# Patient Record
Sex: Female | Born: 2000 | Hispanic: No | Marital: Single | Smoking: Never smoker
Health system: Southern US, Community
[De-identification: ages and names within clinical notes are randomized; demographics above are authoritative.]

## PROBLEM LIST (undated history)

## (undated) DIAGNOSIS — Z789 Other specified health status: Secondary | ICD-10-CM

## (undated) HISTORY — PX: NO PAST SURGERIES: SHX2092

## (undated) HISTORY — DX: Other specified health status: Z78.9

---
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
## 2020-01-22 ENCOUNTER — Emergency Department (HOSPITAL_COMMUNITY): Payer: Medicaid Other

## 2020-01-22 ENCOUNTER — Encounter (HOSPITAL_COMMUNITY): Payer: Self-pay | Admitting: Emergency Medicine

## 2020-01-22 ENCOUNTER — Emergency Department (HOSPITAL_COMMUNITY)
Admission: EM | Admit: 2020-01-22 | Discharge: 2020-01-22 | Disposition: A | Discharge status: Home / Self Care | Payer: Medicaid Other | Attending: Emergency Medicine | Admitting: Emergency Medicine

## 2020-01-22 ENCOUNTER — Other Ambulatory Visit: Payer: Self-pay

## 2020-01-22 DIAGNOSIS — R079 Chest pain, unspecified: Secondary | ICD-10-CM

## 2020-01-22 DIAGNOSIS — R0789 Other chest pain: Secondary | ICD-10-CM | POA: Diagnosis present

## 2020-01-22 DIAGNOSIS — R0602 Shortness of breath: Secondary | ICD-10-CM | POA: Insufficient documentation

## 2020-01-22 DIAGNOSIS — Z79899 Other long term (current) drug therapy: Secondary | ICD-10-CM | POA: Insufficient documentation

## 2020-01-22 LAB — BASIC METABOLIC PANEL
Anion gap: 13 (ref 5–15)
BUN: 14 mg/dL (ref 6–20)
CO2: 18 mmol/L — ABNORMAL LOW (ref 22–32)
Calcium: 9.2 mg/dL (ref 8.9–10.3)
Chloride: 103 mmol/L (ref 98–111)
Creatinine, Ser: 0.64 mg/dL (ref 0.44–1.00)
GFR calc Af Amer: >60 mL/min (ref >60)
GFR calc non Af Amer: >60 mL/min (ref >60)
Glucose, Bld: 74 mg/dL (ref 70–99)
Potassium: 3.5 mmol/L (ref 3.5–5.1)
Sodium: 134 mmol/L — ABNORMAL LOW (ref 135–145)

## 2020-01-22 LAB — CBC
HCT: 40.3 % (ref 36.0–46.0)
Hemoglobin: 12.6 g/dL (ref 12.0–15.0)
MCH: 27 pg (ref 26.0–34.0)
MCHC: 31.3 g/dL (ref 30.0–36.0)
MCV: 86.5 fL (ref 80.0–100.0)
Platelets: 268 10*3/uL (ref 150–400)
RBC: 4.66 MIL/uL (ref 3.87–5.11)
RDW: 11.9 % (ref 11.5–15.5)
WBC: 6 10*3/uL (ref 4.0–10.5)
nRBC: 0 % (ref 0.0–0.2)

## 2020-01-22 LAB — TROPONIN I (HIGH SENSITIVITY)
Troponin I (High Sensitivity): <2 ng/L (ref <18)
Troponin I (High Sensitivity): <2 ng/L (ref <18)

## 2020-01-22 LAB — I-STAT BETA HCG BLOOD, ED (MC, WL, AP ONLY): I-stat hCG, quantitative: <5 m[IU]/mL (ref <5)

## 2020-01-22 MED ORDER — SODIUM CHLORIDE 0.9% FLUSH: 3.0000 mL | Freq: Once | INTRAVENOUS | Status: DC

## 2020-01-22 NOTE — Discharge Instructions (Signed)
Your work-up today was reassuring. We do recommend that you follow-up with your primary care doctor.  Labs/imaging results attached on back. Return here for any new/acute changes.

## 2020-01-22 NOTE — ED Triage Notes (Signed)
Pt c/o sharp chest pain that started at 1600 today, lasting 10 minutes and shortness of breath. Chest pain has since resolved.

## 2020-01-22 NOTE — ED Provider Notes (Signed)
[facility] EMERGENCY DEPARTMENT Provider Note   CSN: [id number] Arrival date & time: 01/22/20  1846     History Chief Complaint  Patient presents with  . Chest Pain  . Shortness of Breath    Stefanie Ponce is a 19 y.o. female.  The history is provided by medical records and the patient.  Chest Pain Associated symptoms: shortness of breath   Shortness of Breath Associated symptoms: chest pain     19 year old female with no significant past medical history presenting to the ED for chest pain that occurred earlier this afternoon.  States around 4 PM she had about a 7 to 10-minute episode of sharp, stabbing chest pain in her left chest with radiation to her back.  States while the pain was intense she had shortness of breath but symptoms all resolved spontaneously.  She is not had any recurrence of pain.  She denies any cough, fever, hemoptysis.  No sick contacts or known Covid exposures.  She has no known cardiac history.  She is not a smoker.  Past Medical History:  Diagnosis Date  . Medical history non-contributory     Patient Active Problem List   Diagnosis Date Noted  . Gestational hypertension 06/26/2019  . [redacted] weeks gestation of pregnancy 06/26/2019  . Indication for care in labor or delivery 06/25/2019    Past Surgical History:  Procedure Laterality Date  . NO PAST SURGERIES       OB History    Gravida  1   Para  1   Term  1   Preterm      AB      Living  1     SAB      TAB      Ectopic      Multiple  0   Live Births  1           No family history on file.  Social History   Tobacco Use  . Smoking status: Never Smoker  . Smokeless tobacco: Never Used  Substance Use Topics  . Alcohol use: Never  . Drug use: Never    Home Medications Prior to Admission medications   Medication Sig Start Date End Date Taking? Authorizing Provider  amLODipine (NORVASC) 5 MG tablet Take 1 tablet (5 mg total) by mouth at  bedtime. 06/26/19   Federico Flake, MD  calcium carbonate (TUMS - DOSED IN MG ELEMENTAL CALCIUM) 500 MG chewable tablet Chew 1 tablet by mouth daily.    [provider]  Prenatal Vit-Fe Fumarate-FA (PRENATAL VITAMIN PO) Take by mouth.    [provider]    Allergies    Patient has no known allergies.  Review of Systems   Review of Systems  Respiratory: Positive for shortness of breath.   Cardiovascular: Positive for chest pain.  All other systems reviewed and are negative.   Physical Exam Updated Vital Signs BP 117/76   Pulse 78   Temp 98.9 F (37.2 C) (Oral)   Resp 16   SpO2 100%   Physical Exam Vitals and nursing note reviewed.  Constitutional:      Appearance: She is well-developed.  HENT:     Head: Normocephalic and atraumatic.  Eyes:     Conjunctiva/sclera: Conjunctivae normal.     Pupils: Pupils are equal, round, and reactive to light.  Cardiovascular:     Rate and Rhythm: Normal rate and regular rhythm.     Heart sounds: Normal heart sounds.  Pulmonary:     Effort: Pulmonary effort is normal.     Breath sounds: Normal breath sounds. No decreased breath sounds, wheezing or rhonchi.  Abdominal:     General: Bowel sounds are normal.     Palpations: Abdomen is soft.  Musculoskeletal:        General: Normal range of motion.     Cervical back: Normal range of motion.  Skin:    General: Skin is warm and dry.  Neurological:     Mental Status: She is alert and oriented to person, place, and time.     ED Results / Procedures / Treatments   Labs (all labs ordered are listed, but only abnormal results are displayed) Labs Reviewed  BASIC METABOLIC PANEL - Abnormal; Notable for the following components:      Result Value   Sodium 134 (*)    CO2 18 (*)    All other components within normal limits  CBC  I-STAT BETA HCG BLOOD, ED ([facility], [facility], [facility] ONLY)  TROPONIN I (HIGH SENSITIVITY)  TROPONIN I (HIGH SENSITIVITY)     EKG None  Radiology DG Chest 2 View  Result Date: 01/22/2020 CLINICAL DATA:  Chest pain. EXAM: CHEST - 2 VIEW COMPARISON:  None. FINDINGS: The heart size and mediastinal contours are within normal limits. Both lungs are clear. The visualized skeletal structures are unremarkable. IMPRESSION: No active cardiopulmonary disease. Electronically Signed   By: Virgina Norfolk M.D.   On: 01/22/2020 19:30    Procedures Procedures (including critical care time)  Medications Ordered in ED Medications  sodium chloride flush (NS) 0.9 % injection 3 mL (has no administration in time range)    ED Course  I have reviewed the triage vital signs and the nursing notes.  Pertinent labs & imaging results that were available during my care of the patient were reviewed by me and considered in my medical decision making (see chart for details).    MDM Rules/Calculators/A&P  19 year old female here after a brief episode of chest pain that has resolved.  No recurrence of pain.  No known cardiac history.  She is afebrile and nontoxic in appearance here.  EKG with nonspecific T wave changes but no acute ischemia.  Labs are reassuring including troponin x2.  Chest x-ray is clear.  Patient remains pain-free throughout ER visit.  She is PERC negative.  Low suspicion for ACS, PE, dissection, or other acute cardiac event.  Feel she stable for discharge home.  Recommended close follow-up with PCP.  Return here for any new or acute changes.  Final Clinical Impression(s) / ED Diagnoses Final diagnoses:  Chest pain in adult    Rx / DC Orders ED Discharge Orders    None       Larene Pickett, PA-C 01/22/20 [place], DO 01/23/20 [phone]

## 2020-02-17 DIAGNOSIS — Z1388 Encounter for screening for disorder due to exposure to contaminants: Secondary | ICD-10-CM | POA: Diagnosis not present

## 2020-02-17 DIAGNOSIS — Z3009 Encounter for other general counseling and advice on contraception: Secondary | ICD-10-CM | POA: Diagnosis not present

## 2020-02-17 DIAGNOSIS — Z0389 Encounter for observation for other suspected diseases and conditions ruled out: Secondary | ICD-10-CM | POA: Diagnosis not present

## 2020-04-22 DIAGNOSIS — N644 Mastodynia: Secondary | ICD-10-CM | POA: Diagnosis not present

## 2020-09-29 IMAGING — CR DG CHEST 2V
2 series · 2 of 2 positions shown · non-contrast
Comparison: None.

CLINICAL DATA: Chest pain.

EXAM:
CHEST - 2 VIEW

[chest pa]
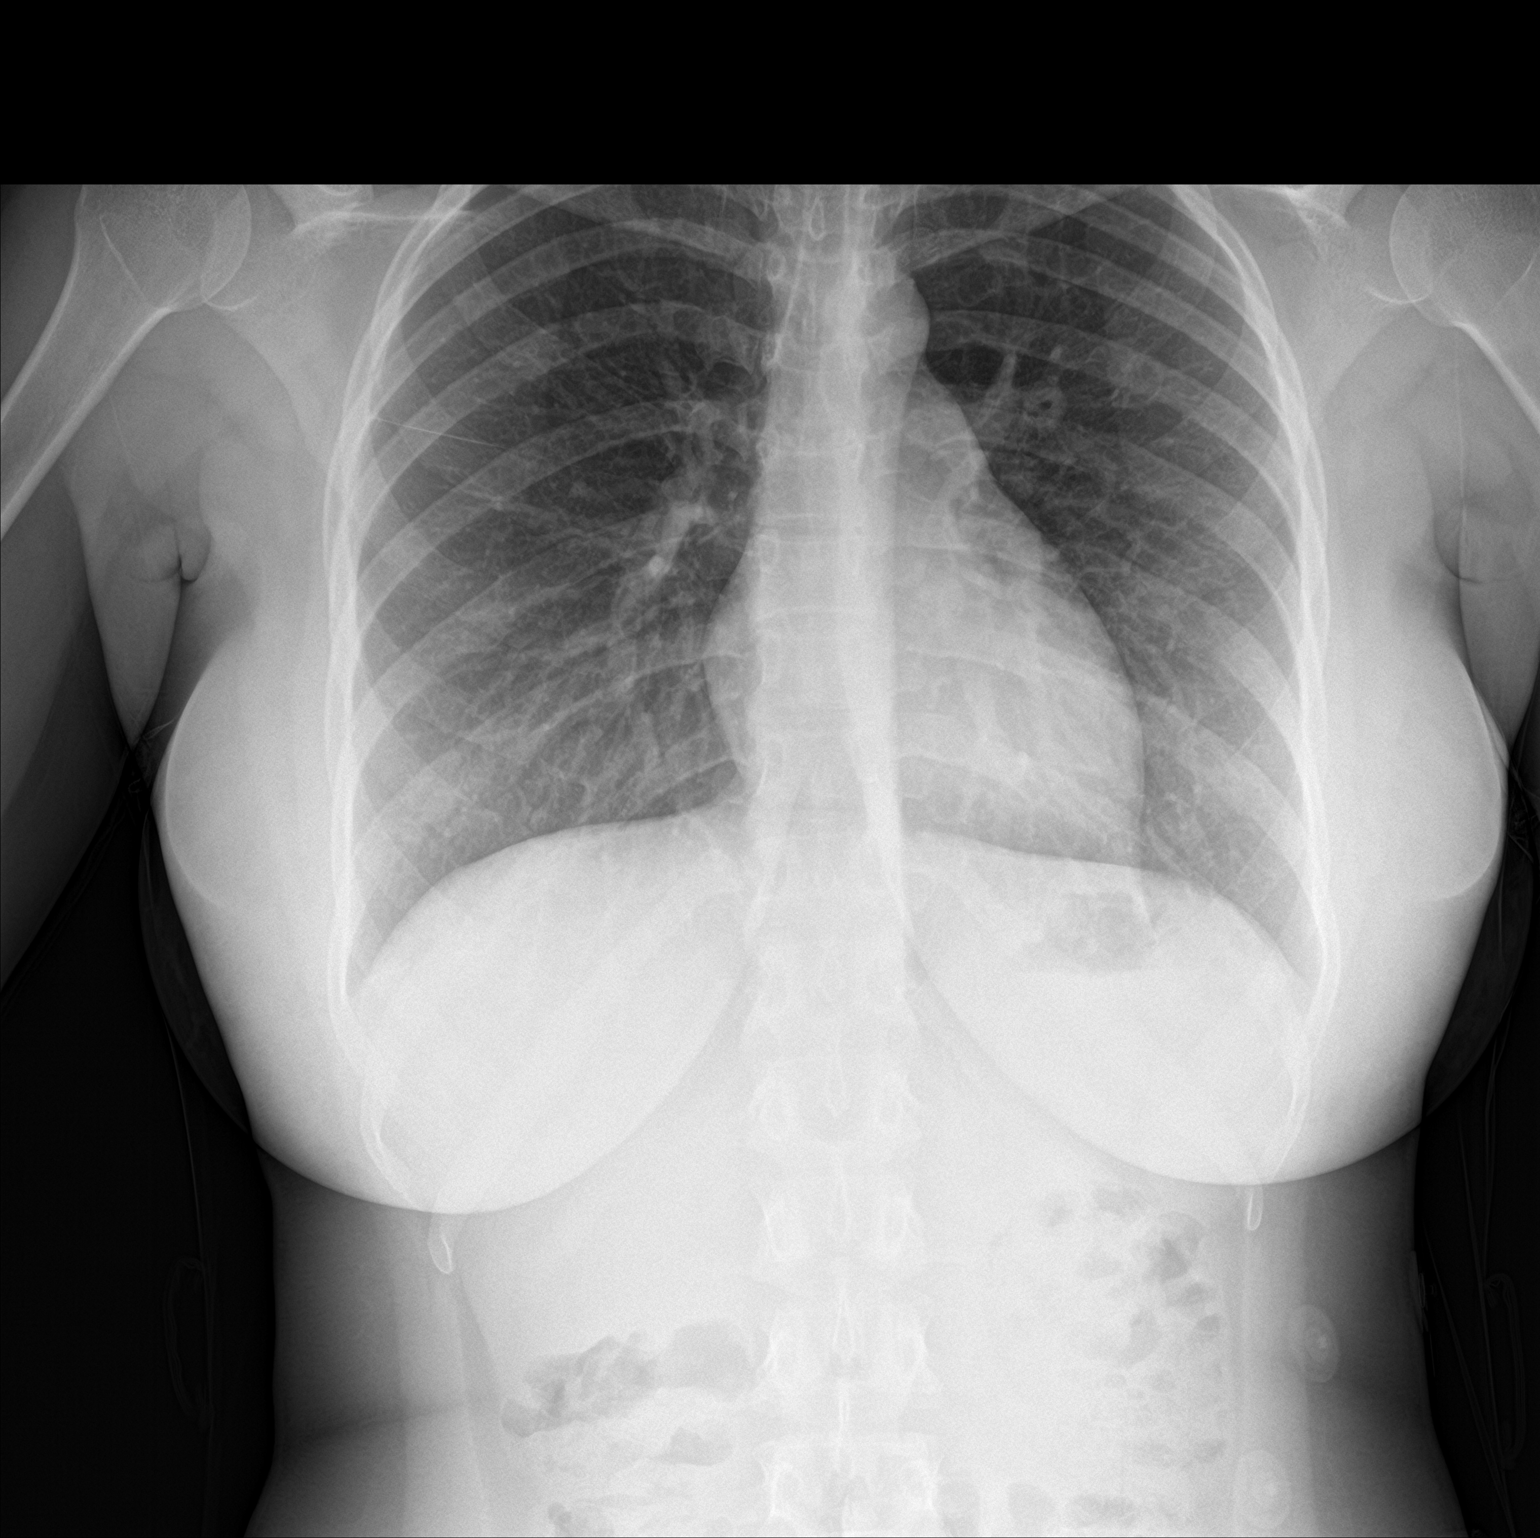

[chest lat]
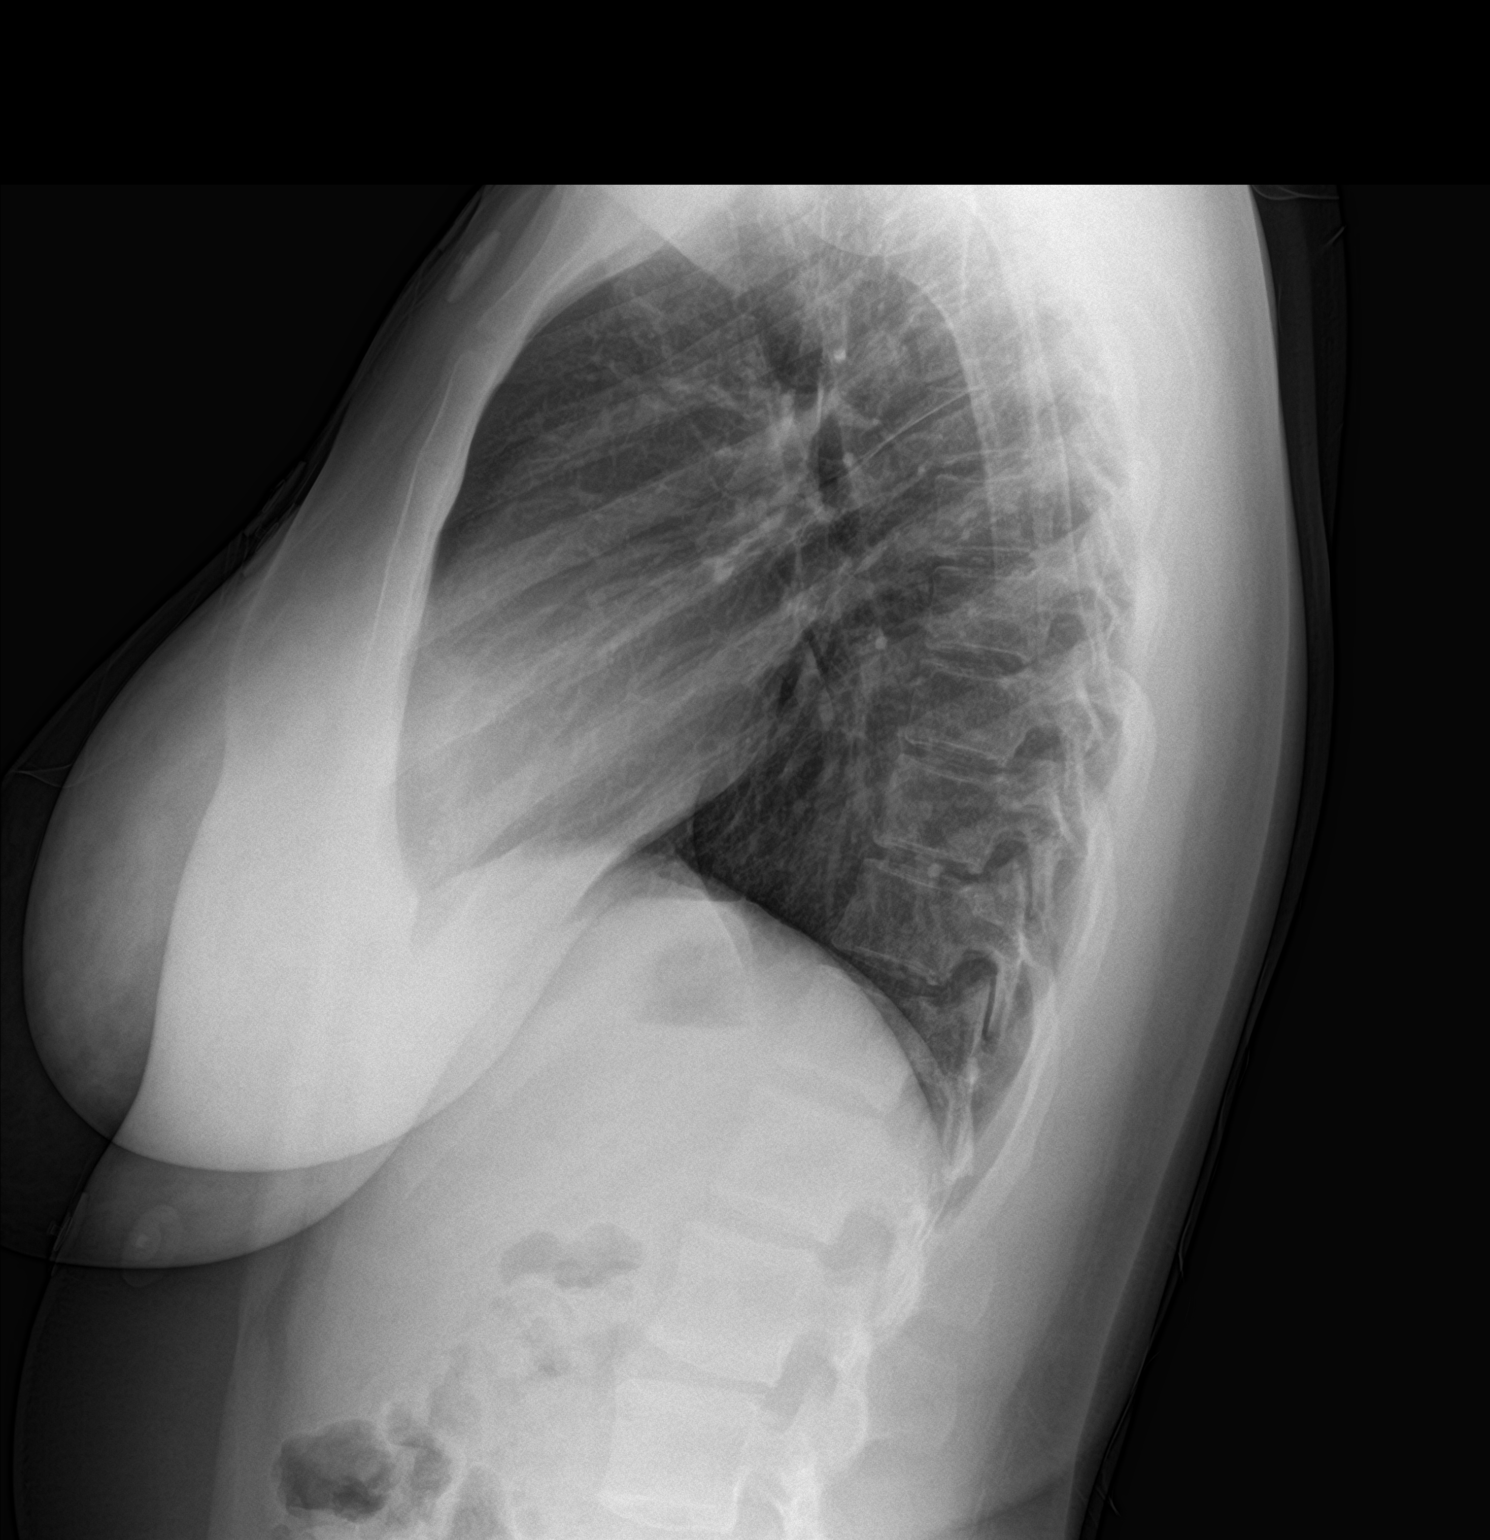

[2 of 2 positions shown; findings below may reference images not displayed]

FINDINGS: The heart size and mediastinal contours are within normal limits.
Both lungs are clear. The visualized skeletal structures are
unremarkable.
IMPRESSION: No active cardiopulmonary disease.

## 2021-08-30 DIAGNOSIS — F4310 Post-traumatic stress disorder, unspecified: Secondary | ICD-10-CM | POA: Diagnosis not present

## 2021-08-30 DIAGNOSIS — F411 Generalized anxiety disorder: Secondary | ICD-10-CM | POA: Diagnosis not present

## 2021-09-06 DIAGNOSIS — F4310 Post-traumatic stress disorder, unspecified: Secondary | ICD-10-CM | POA: Diagnosis not present

## 2021-09-06 DIAGNOSIS — F411 Generalized anxiety disorder: Secondary | ICD-10-CM | POA: Diagnosis not present

## 2021-09-13 DIAGNOSIS — F4310 Post-traumatic stress disorder, unspecified: Secondary | ICD-10-CM | POA: Diagnosis not present

## 2021-09-13 DIAGNOSIS — F411 Generalized anxiety disorder: Secondary | ICD-10-CM | POA: Diagnosis not present

## 2022-03-22 DIAGNOSIS — F4310 Post-traumatic stress disorder, unspecified: Secondary | ICD-10-CM | POA: Diagnosis not present

## 2022-03-22 DIAGNOSIS — F411 Generalized anxiety disorder: Secondary | ICD-10-CM | POA: Diagnosis not present

## 2022-03-29 DIAGNOSIS — F411 Generalized anxiety disorder: Secondary | ICD-10-CM | POA: Diagnosis not present

## 2022-03-29 DIAGNOSIS — F4310 Post-traumatic stress disorder, unspecified: Secondary | ICD-10-CM | POA: Diagnosis not present

## 2022-04-05 DIAGNOSIS — F4310 Post-traumatic stress disorder, unspecified: Secondary | ICD-10-CM | POA: Diagnosis not present

## 2022-04-05 DIAGNOSIS — F411 Generalized anxiety disorder: Secondary | ICD-10-CM | POA: Diagnosis not present

## 2022-04-12 DIAGNOSIS — F411 Generalized anxiety disorder: Secondary | ICD-10-CM | POA: Diagnosis not present

## 2022-04-12 DIAGNOSIS — F4310 Post-traumatic stress disorder, unspecified: Secondary | ICD-10-CM | POA: Diagnosis not present

## 2022-04-19 DIAGNOSIS — F4310 Post-traumatic stress disorder, unspecified: Secondary | ICD-10-CM | POA: Diagnosis not present

## 2022-04-19 DIAGNOSIS — F411 Generalized anxiety disorder: Secondary | ICD-10-CM | POA: Diagnosis not present

## 2022-04-26 DIAGNOSIS — F4310 Post-traumatic stress disorder, unspecified: Secondary | ICD-10-CM | POA: Diagnosis not present

## 2022-04-26 DIAGNOSIS — F411 Generalized anxiety disorder: Secondary | ICD-10-CM | POA: Diagnosis not present

## 2022-05-03 DIAGNOSIS — F411 Generalized anxiety disorder: Secondary | ICD-10-CM | POA: Diagnosis not present

## 2022-05-03 DIAGNOSIS — F4310 Post-traumatic stress disorder, unspecified: Secondary | ICD-10-CM | POA: Diagnosis not present

## 2022-05-10 DIAGNOSIS — F411 Generalized anxiety disorder: Secondary | ICD-10-CM | POA: Diagnosis not present

## 2022-05-10 DIAGNOSIS — F4310 Post-traumatic stress disorder, unspecified: Secondary | ICD-10-CM | POA: Diagnosis not present

## 2022-05-17 DIAGNOSIS — F4310 Post-traumatic stress disorder, unspecified: Secondary | ICD-10-CM | POA: Diagnosis not present

## 2022-05-17 DIAGNOSIS — F411 Generalized anxiety disorder: Secondary | ICD-10-CM | POA: Diagnosis not present

## 2022-05-24 DIAGNOSIS — F4310 Post-traumatic stress disorder, unspecified: Secondary | ICD-10-CM | POA: Diagnosis not present

## 2022-05-24 DIAGNOSIS — F411 Generalized anxiety disorder: Secondary | ICD-10-CM | POA: Diagnosis not present

## 2022-06-14 DIAGNOSIS — F4310 Post-traumatic stress disorder, unspecified: Secondary | ICD-10-CM | POA: Diagnosis not present

## 2022-06-14 DIAGNOSIS — F411 Generalized anxiety disorder: Secondary | ICD-10-CM | POA: Diagnosis not present

## 2022-06-21 DIAGNOSIS — F411 Generalized anxiety disorder: Secondary | ICD-10-CM | POA: Diagnosis not present

## 2022-06-21 DIAGNOSIS — F4310 Post-traumatic stress disorder, unspecified: Secondary | ICD-10-CM | POA: Diagnosis not present

## 2022-06-28 DIAGNOSIS — F4310 Post-traumatic stress disorder, unspecified: Secondary | ICD-10-CM | POA: Diagnosis not present

## 2022-06-28 DIAGNOSIS — F411 Generalized anxiety disorder: Secondary | ICD-10-CM | POA: Diagnosis not present

## 2022-07-05 DIAGNOSIS — F411 Generalized anxiety disorder: Secondary | ICD-10-CM | POA: Diagnosis not present

## 2022-07-05 DIAGNOSIS — F4310 Post-traumatic stress disorder, unspecified: Secondary | ICD-10-CM | POA: Diagnosis not present

## 2022-07-12 DIAGNOSIS — F411 Generalized anxiety disorder: Secondary | ICD-10-CM | POA: Diagnosis not present

## 2022-07-12 DIAGNOSIS — F4310 Post-traumatic stress disorder, unspecified: Secondary | ICD-10-CM | POA: Diagnosis not present

## 2022-07-19 DIAGNOSIS — F4310 Post-traumatic stress disorder, unspecified: Secondary | ICD-10-CM | POA: Diagnosis not present

## 2022-07-19 DIAGNOSIS — F411 Generalized anxiety disorder: Secondary | ICD-10-CM | POA: Diagnosis not present

## 2022-07-27 DIAGNOSIS — F411 Generalized anxiety disorder: Secondary | ICD-10-CM | POA: Diagnosis not present

## 2022-07-27 DIAGNOSIS — F4310 Post-traumatic stress disorder, unspecified: Secondary | ICD-10-CM | POA: Diagnosis not present

## 2022-08-02 DIAGNOSIS — F4310 Post-traumatic stress disorder, unspecified: Secondary | ICD-10-CM | POA: Diagnosis not present

## 2022-08-02 DIAGNOSIS — F411 Generalized anxiety disorder: Secondary | ICD-10-CM | POA: Diagnosis not present

## 2022-08-09 DIAGNOSIS — F4310 Post-traumatic stress disorder, unspecified: Secondary | ICD-10-CM | POA: Diagnosis not present

## 2022-08-09 DIAGNOSIS — F411 Generalized anxiety disorder: Secondary | ICD-10-CM | POA: Diagnosis not present

## 2022-08-16 DIAGNOSIS — F411 Generalized anxiety disorder: Secondary | ICD-10-CM | POA: Diagnosis not present

## 2022-08-16 DIAGNOSIS — F4310 Post-traumatic stress disorder, unspecified: Secondary | ICD-10-CM | POA: Diagnosis not present

## 2022-08-30 DIAGNOSIS — F4310 Post-traumatic stress disorder, unspecified: Secondary | ICD-10-CM | POA: Diagnosis not present

## 2022-08-30 DIAGNOSIS — F411 Generalized anxiety disorder: Secondary | ICD-10-CM | POA: Diagnosis not present

## 2022-09-06 DIAGNOSIS — F4310 Post-traumatic stress disorder, unspecified: Secondary | ICD-10-CM | POA: Diagnosis not present

## 2022-09-06 DIAGNOSIS — F411 Generalized anxiety disorder: Secondary | ICD-10-CM | POA: Diagnosis not present

## 2022-09-17 DIAGNOSIS — Z3202 Encounter for pregnancy test, result negative: Secondary | ICD-10-CM | POA: Diagnosis not present

## 2022-09-17 DIAGNOSIS — Z3046 Encounter for surveillance of implantable subdermal contraceptive: Secondary | ICD-10-CM | POA: Diagnosis not present

## 2022-10-09 DIAGNOSIS — F411 Generalized anxiety disorder: Secondary | ICD-10-CM | POA: Diagnosis not present

## 2022-10-09 DIAGNOSIS — F4310 Post-traumatic stress disorder, unspecified: Secondary | ICD-10-CM | POA: Diagnosis not present

## 2022-10-18 DIAGNOSIS — F4310 Post-traumatic stress disorder, unspecified: Secondary | ICD-10-CM | POA: Diagnosis not present

## 2022-10-18 DIAGNOSIS — F411 Generalized anxiety disorder: Secondary | ICD-10-CM | POA: Diagnosis not present

## 2022-10-25 DIAGNOSIS — F411 Generalized anxiety disorder: Secondary | ICD-10-CM | POA: Diagnosis not present

## 2022-10-25 DIAGNOSIS — F4310 Post-traumatic stress disorder, unspecified: Secondary | ICD-10-CM | POA: Diagnosis not present

## 2022-11-01 DIAGNOSIS — F4310 Post-traumatic stress disorder, unspecified: Secondary | ICD-10-CM | POA: Diagnosis not present

## 2022-11-01 DIAGNOSIS — F411 Generalized anxiety disorder: Secondary | ICD-10-CM | POA: Diagnosis not present

## 2022-11-08 DIAGNOSIS — F4310 Post-traumatic stress disorder, unspecified: Secondary | ICD-10-CM | POA: Diagnosis not present

## 2022-11-08 DIAGNOSIS — F411 Generalized anxiety disorder: Secondary | ICD-10-CM | POA: Diagnosis not present

## 2022-11-15 DIAGNOSIS — F4310 Post-traumatic stress disorder, unspecified: Secondary | ICD-10-CM | POA: Diagnosis not present

## 2022-11-15 DIAGNOSIS — F411 Generalized anxiety disorder: Secondary | ICD-10-CM | POA: Diagnosis not present

## 2022-11-22 DIAGNOSIS — F4310 Post-traumatic stress disorder, unspecified: Secondary | ICD-10-CM | POA: Diagnosis not present

## 2022-11-22 DIAGNOSIS — F411 Generalized anxiety disorder: Secondary | ICD-10-CM | POA: Diagnosis not present

## 2022-11-29 DIAGNOSIS — F4310 Post-traumatic stress disorder, unspecified: Secondary | ICD-10-CM | POA: Diagnosis not present

## 2022-11-29 DIAGNOSIS — F411 Generalized anxiety disorder: Secondary | ICD-10-CM | POA: Diagnosis not present

## 2022-12-06 DIAGNOSIS — F411 Generalized anxiety disorder: Secondary | ICD-10-CM | POA: Diagnosis not present

## 2022-12-06 DIAGNOSIS — F4310 Post-traumatic stress disorder, unspecified: Secondary | ICD-10-CM | POA: Diagnosis not present

## 2022-12-13 DIAGNOSIS — F4310 Post-traumatic stress disorder, unspecified: Secondary | ICD-10-CM | POA: Diagnosis not present

## 2022-12-13 DIAGNOSIS — F411 Generalized anxiety disorder: Secondary | ICD-10-CM | POA: Diagnosis not present

## 2022-12-20 DIAGNOSIS — F4310 Post-traumatic stress disorder, unspecified: Secondary | ICD-10-CM | POA: Diagnosis not present

## 2022-12-20 DIAGNOSIS — F411 Generalized anxiety disorder: Secondary | ICD-10-CM | POA: Diagnosis not present

## 2022-12-27 DIAGNOSIS — F4310 Post-traumatic stress disorder, unspecified: Secondary | ICD-10-CM | POA: Diagnosis not present

## 2022-12-27 DIAGNOSIS — F411 Generalized anxiety disorder: Secondary | ICD-10-CM | POA: Diagnosis not present

## 2023-01-03 DIAGNOSIS — F4310 Post-traumatic stress disorder, unspecified: Secondary | ICD-10-CM | POA: Diagnosis not present

## 2023-01-03 DIAGNOSIS — F411 Generalized anxiety disorder: Secondary | ICD-10-CM | POA: Diagnosis not present

## 2023-01-10 DIAGNOSIS — F411 Generalized anxiety disorder: Secondary | ICD-10-CM | POA: Diagnosis not present

## 2023-01-10 DIAGNOSIS — F4310 Post-traumatic stress disorder, unspecified: Secondary | ICD-10-CM | POA: Diagnosis not present

## 2023-01-17 DIAGNOSIS — F411 Generalized anxiety disorder: Secondary | ICD-10-CM | POA: Diagnosis not present

## 2023-01-17 DIAGNOSIS — F4310 Post-traumatic stress disorder, unspecified: Secondary | ICD-10-CM | POA: Diagnosis not present

## 2023-01-24 DIAGNOSIS — F411 Generalized anxiety disorder: Secondary | ICD-10-CM | POA: Diagnosis not present

## 2023-01-24 DIAGNOSIS — F4310 Post-traumatic stress disorder, unspecified: Secondary | ICD-10-CM | POA: Diagnosis not present

## 2023-02-07 DIAGNOSIS — F411 Generalized anxiety disorder: Secondary | ICD-10-CM | POA: Diagnosis not present

## 2023-02-07 DIAGNOSIS — F4310 Post-traumatic stress disorder, unspecified: Secondary | ICD-10-CM | POA: Diagnosis not present

## 2023-02-21 DIAGNOSIS — F411 Generalized anxiety disorder: Secondary | ICD-10-CM | POA: Diagnosis not present

## 2023-02-21 DIAGNOSIS — F4310 Post-traumatic stress disorder, unspecified: Secondary | ICD-10-CM | POA: Diagnosis not present

## 2023-02-28 DIAGNOSIS — F4310 Post-traumatic stress disorder, unspecified: Secondary | ICD-10-CM | POA: Diagnosis not present

## 2023-02-28 DIAGNOSIS — F411 Generalized anxiety disorder: Secondary | ICD-10-CM | POA: Diagnosis not present

## 2023-03-07 DIAGNOSIS — F4310 Post-traumatic stress disorder, unspecified: Secondary | ICD-10-CM | POA: Diagnosis not present

## 2023-03-07 DIAGNOSIS — F411 Generalized anxiety disorder: Secondary | ICD-10-CM | POA: Diagnosis not present

## 2023-03-14 DIAGNOSIS — F411 Generalized anxiety disorder: Secondary | ICD-10-CM | POA: Diagnosis not present

## 2023-03-14 DIAGNOSIS — F4310 Post-traumatic stress disorder, unspecified: Secondary | ICD-10-CM | POA: Diagnosis not present

## 2023-03-21 DIAGNOSIS — F411 Generalized anxiety disorder: Secondary | ICD-10-CM | POA: Diagnosis not present

## 2023-03-21 DIAGNOSIS — F4310 Post-traumatic stress disorder, unspecified: Secondary | ICD-10-CM | POA: Diagnosis not present

## 2023-03-28 DIAGNOSIS — F4310 Post-traumatic stress disorder, unspecified: Secondary | ICD-10-CM | POA: Diagnosis not present

## 2023-03-28 DIAGNOSIS — F411 Generalized anxiety disorder: Secondary | ICD-10-CM | POA: Diagnosis not present

## 2023-04-05 DIAGNOSIS — F411 Generalized anxiety disorder: Secondary | ICD-10-CM | POA: Diagnosis not present

## 2023-04-05 DIAGNOSIS — F4310 Post-traumatic stress disorder, unspecified: Secondary | ICD-10-CM | POA: Diagnosis not present

## 2023-04-11 DIAGNOSIS — F411 Generalized anxiety disorder: Secondary | ICD-10-CM | POA: Diagnosis not present

## 2023-04-11 DIAGNOSIS — F4310 Post-traumatic stress disorder, unspecified: Secondary | ICD-10-CM | POA: Diagnosis not present

## 2023-04-18 DIAGNOSIS — F4310 Post-traumatic stress disorder, unspecified: Secondary | ICD-10-CM | POA: Diagnosis not present

## 2023-04-18 DIAGNOSIS — F411 Generalized anxiety disorder: Secondary | ICD-10-CM | POA: Diagnosis not present

## 2023-04-25 DIAGNOSIS — F411 Generalized anxiety disorder: Secondary | ICD-10-CM | POA: Diagnosis not present

## 2023-04-25 DIAGNOSIS — F4310 Post-traumatic stress disorder, unspecified: Secondary | ICD-10-CM | POA: Diagnosis not present
